# Patient Record
Sex: Male | Born: 1970 | Race: Black or African American | Hispanic: No | Marital: Married | Smoking: Current every day smoker
Health system: Southern US, Community
[De-identification: ages and names within clinical notes are randomized; demographics above are authoritative.]

---

## 2008-07-05 ENCOUNTER — Emergency Department (HOSPITAL_COMMUNITY): Admission: EM | Admit: 2008-07-05 | Discharge: 2008-07-05 | Payer: Self-pay | Admitting: Emergency Medicine

## 2008-07-18 ENCOUNTER — Ambulatory Visit: Payer: Self-pay | Admitting: Family Medicine

## 2008-07-18 DIAGNOSIS — K029 Dental caries, unspecified: Secondary | ICD-10-CM | POA: Insufficient documentation

## 2008-07-18 DIAGNOSIS — K089 Disorder of teeth and supporting structures, unspecified: Secondary | ICD-10-CM | POA: Insufficient documentation

## 2009-06-15 ENCOUNTER — Encounter (INDEPENDENT_AMBULATORY_CARE_PROVIDER_SITE_OTHER): Payer: Self-pay | Admitting: *Deleted

## 2009-06-15 DIAGNOSIS — F17200 Nicotine dependence, unspecified, uncomplicated: Secondary | ICD-10-CM

## 2013-12-25 ENCOUNTER — Encounter (HOSPITAL_COMMUNITY): Payer: Self-pay | Admitting: Emergency Medicine

## 2013-12-25 ENCOUNTER — Emergency Department (HOSPITAL_COMMUNITY): Payer: Medicaid Other

## 2013-12-25 ENCOUNTER — Emergency Department (HOSPITAL_COMMUNITY)
Admission: EM | Admit: 2013-12-25 | Discharge: 2013-12-25 | Disposition: A | Discharge status: Home / Self Care | Payer: Medicaid Other | Source: Home / Self Care | Attending: Emergency Medicine | Admitting: Emergency Medicine

## 2013-12-25 ENCOUNTER — Emergency Department (HOSPITAL_COMMUNITY)
Admission: EM | Admit: 2013-12-25 | Discharge: 2013-12-25 | Disposition: A | Discharge status: Home / Self Care | Payer: Medicaid Other | Attending: Emergency Medicine | Admitting: Emergency Medicine

## 2013-12-25 DIAGNOSIS — Z23 Encounter for immunization: Secondary | ICD-10-CM | POA: Insufficient documentation

## 2013-12-25 DIAGNOSIS — F17200 Nicotine dependence, unspecified, uncomplicated: Secondary | ICD-10-CM | POA: Insufficient documentation

## 2013-12-25 DIAGNOSIS — R51 Headache: Secondary | ICD-10-CM | POA: Insufficient documentation

## 2013-12-25 DIAGNOSIS — Y939 Activity, unspecified: Secondary | ICD-10-CM | POA: Insufficient documentation

## 2013-12-25 DIAGNOSIS — S0010XA Contusion of unspecified eyelid and periocular area, initial encounter: Secondary | ICD-10-CM | POA: Insufficient documentation

## 2013-12-25 DIAGNOSIS — S0500XA Injury of conjunctiva and corneal abrasion without foreign body, unspecified eye, initial encounter: Secondary | ICD-10-CM

## 2013-12-25 DIAGNOSIS — IMO0002 Reserved for concepts with insufficient information to code with codable children: Secondary | ICD-10-CM | POA: Insufficient documentation

## 2013-12-25 DIAGNOSIS — S058X9A Other injuries of unspecified eye and orbit, initial encounter: Secondary | ICD-10-CM | POA: Insufficient documentation

## 2013-12-25 DIAGNOSIS — Y929 Unspecified place or not applicable: Secondary | ICD-10-CM | POA: Insufficient documentation

## 2013-12-25 MED ORDER — OXYCODONE-ACETAMINOPHEN 5-325 MG PO TABS: 2.0000 | ORAL_TABLET | Freq: Four times a day (QID) | ORAL | Status: DC | PRN

## 2013-12-25 MED ORDER — OXYCODONE-ACETAMINOPHEN 5-325 MG PO TABS
2.0000 | ORAL_TABLET | Freq: Once | ORAL | Status: AC
  Administered 2013-12-25: 2 via ORAL
  Filled 2013-12-25: qty 2

## 2013-12-25 MED ORDER — ONDANSETRON 4 MG PO TBDP
8.0000 mg | ORAL_TABLET | Freq: Once | ORAL | Status: AC
  Administered 2013-12-25: 8 mg via ORAL
  Filled 2013-12-25: qty 2

## 2013-12-25 MED ORDER — HYDROMORPHONE HCL PF 1 MG/ML IJ SOLN
1.0000 mg | Freq: Once | INTRAMUSCULAR | Status: DC
  Filled 2013-12-25: qty 1

## 2013-12-25 MED ORDER — TETANUS-DIPHTH-ACELL PERTUSSIS 5-2.5-18.5 LF-MCG/0.5 IM SUSP
0.5000 mL | Freq: Once | INTRAMUSCULAR | Status: AC
  Administered 2013-12-25: 0.5 mL via INTRAMUSCULAR
  Filled 2013-12-25: qty 0.5

## 2013-12-25 MED ORDER — PROMETHAZINE HCL 25 MG PO TABS: 25.0000 mg | ORAL_TABLET | Freq: Four times a day (QID) | ORAL | Status: DC | PRN

## 2013-12-25 MED ORDER — FLUORESCEIN SODIUM 1 MG OP STRP
1.0000 | ORAL_STRIP | Freq: Once | OPHTHALMIC | Status: AC
  Administered 2013-12-25: 1 via OPHTHALMIC
  Filled 2013-12-25: qty 1

## 2013-12-25 MED ORDER — ERYTHROMYCIN 5 MG/GM OP OINT: TOPICAL_OINTMENT | OPHTHALMIC | Status: DC

## 2013-12-25 MED ORDER — TETRACAINE HCL 0.5 % OP SOLN
2.0000 [drp] | Freq: Once | OPHTHALMIC | Status: AC
  Administered 2013-12-25: 2 [drp] via OPHTHALMIC
  Filled 2013-12-25: qty 2
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
## 2013-12-25 NOTE — Discharge Instructions (Signed)
Corneal Abrasion  The cornea is the clear covering at the front and center of the eye. When looking at the colored portion of the eye (iris), you are looking through the cornea. This very thin tissue is made up of many layers. The surface layer is a single layer of cells (corneal epithelium) and is one of the most sensitive tissues in the body. If a scratch or injury causes the corneal epithelium to come off, it is called a corneal abrasion. If the injury extends to the tissues below the epithelium, the condition is called a corneal ulcer.  CAUSES    Scratches.   Trauma.   Foreign body in the eye.  Some people have recurrences of abrasions in the area of the original injury even after it has healed (recurrent erosion syndrome). Recurrent erosion syndrome generally improves and goes away with time.  SYMPTOMS    Eye pain.   Difficulty or inability to keep the injured eye open.   The eye becomes very sensitive to light.   Recurrent erosions tend to happen suddenly, first thing in the morning, usually after waking up and opening the eye.  DIAGNOSIS   Your health care provider can diagnose a corneal abrasion during an eye exam. Dye is usually placed in the eye using a drop or a small paper strip moistened by your tears. When the eye is examined with a special light, the abrasion shows up clearly because of the dye.  TREATMENT    Small abrasions may be treated with antibiotic drops or ointment alone.   Usually a pressure patch is specially applied. Pressure patches prevent the eye from blinking, allowing the corneal epithelium to heal. A pressure patch also reduces the amount of pain present in the eye during healing. Most corneal abrasions heal within 2 3 days with no effect on vision.  If the abrasion becomes infected and spreads to the deeper tissues of the cornea, a corneal ulcer can result. This is serious because it can cause corneal scarring. Corneal scars interfere with light passing through the cornea  and cause a loss of vision in the involved eye.  HOME CARE INSTRUCTIONS   Use medicine or ointment as directed. Only take over-the-counter or prescription medicines for pain, discomfort, or fever as directed by your health care provider.   Do not drive or operate machinery while your eye is patched. Your ability to judge distances is impaired.   If your health care provider has given you a follow-up appointment, it is very important to keep that appointment. Not keeping the appointment could result in a severe eye infection or permanent loss of vision. If there is any problem keeping the appointment, let your health care provider know.  SEEK MEDICAL CARE IF:    You have pain, light sensitivity, and a scratchy feeling in one eye or both eyes.   Your pressure patch keeps loosening up, and you can blink your eye under the patch after treatment.   Any kind of discharge develops from the eye after treatment or if the lids stick together in the morning.   You have the same symptoms in the morning as you did with the original abrasion days, weeks, or months after the abrasion healed.  MAKE SURE YOU:    Understand these instructions.   Will watch your condition.   Will get help right away if you are not doing well or get worse.  Document Released: 09/11/2000 Document Revised: 07/05/2013 Document Reviewed: 05/22/2013  ExitCare Patient Information 
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
## 2013-12-25 NOTE — ED Notes (Signed)
PT refused IM pain meds. Verble order given for PO pain med.

## 2013-12-25 NOTE — ED Notes (Signed)
Family taken to room.

## 2013-12-25 NOTE — ED Notes (Signed)
Patient transported to CT 

## 2013-12-25 NOTE — ED Notes (Addendum)
Pt now stating that this is not correct name; pt initially verified ID as incorrect info; charting deleted and completed on wrong pt

## 2013-12-27 NOTE — ED Provider Notes (Signed)
Medical screening examination/treatment/procedure(s) were performed by non-physician practitioner and as supervising physician I was immediately available for consultation/collaboration.     Hurman HornJohn M Roxine Whittinghill, MD 12/27/13 [phone]

## 2014-11-13 ENCOUNTER — Encounter (HOSPITAL_COMMUNITY): Payer: Self-pay | Admitting: *Deleted

## 2014-11-13 ENCOUNTER — Emergency Department (INDEPENDENT_AMBULATORY_CARE_PROVIDER_SITE_OTHER)
Admission: EM | Admit: 2014-11-13 | Discharge: 2014-11-13 | Disposition: A | Discharge status: Home / Self Care | Payer: Medicaid Other | Source: Home / Self Care | Attending: Family Medicine | Admitting: Family Medicine

## 2014-11-13 DIAGNOSIS — K0889 Other specified disorders of teeth and supporting structures: Secondary | ICD-10-CM

## 2014-11-13 DIAGNOSIS — K088 Other specified disorders of teeth and supporting structures: Secondary | ICD-10-CM

## 2014-11-13 MED ORDER — AMOXICILLIN 500 MG PO CAPS: 500.0000 mg | ORAL_CAPSULE | Freq: Two times a day (BID) | ORAL | Status: DC

## 2014-11-13 MED ORDER — HYDROCODONE-ACETAMINOPHEN 5-325 MG PO TABS
1.0000 | ORAL_TABLET | Freq: Four times a day (QID) | ORAL | Status: DC | PRN
Start: 2014-11-13 — End: 2018-09-18

## 2014-11-13 MED ORDER — IBUPROFEN 800 MG PO TABS: 800.0000 mg | ORAL_TABLET | Freq: Three times a day (TID) | ORAL | Status: DC | PRN

## 2014-11-13 NOTE — Discharge Instructions (Signed)
Be sure to see your dentist   Take ibuprofen for pain and norco for very severe pain   Dental Pain A tooth ache may be caused by cavities (tooth decay). Cavities expose the nerve of the tooth to air and hot or cold temperatures. It may come from an infection or abscess (also called a boil or furuncle) around your tooth. It is also often caused by dental caries (tooth decay). This causes the pain you are having. DIAGNOSIS  Your caregiver can diagnose this problem by exam. TREATMENT   If caused by an infection, it may be treated with medications which kill germs (antibiotics) and pain medications as prescribed by your caregiver. Take medications as directed.  Only take over-the-counter or prescription medicines for pain, discomfort, or fever as directed by your caregiver.  Whether the tooth ache today is caused by infection or dental disease, you should see your dentist as soon as possible for further care. SEEK MEDICAL CARE IF: The exam and treatment you received today has been provided on an emergency basis only. This is not a substitute for complete medical or dental care. If your problem worsens or new problems (symptoms) appear, and you are unable to meet with your dentist, call or return to this location. SEEK IMMEDIATE MEDICAL CARE IF:   You have a fever.  You develop redness and swelling of your face, jaw, or neck.  You are unable to open your mouth.  You have severe pain uncontrolled by pain medicine. MAKE SURE YOU:   Understand these instructions.  Will watch your condition.  Will get help right away if you are not doing well or get worse. Document Released: 09/14/2005 Document Revised: 12/07/2011 Document Reviewed: 05/02/2008 [facility] Patient Information 2015 [employer], [place]. This information is not intended to replace advice given to you by your health care provider. Make sure you discuss any questions you have with your health care provider.

## 2014-11-13 NOTE — ED Notes (Signed)
Pt  Reports  Pain  And  Swelling  To  r  Lower      Jaw        X  3  Days       -  He  States  The  Swelling  Has   decreased   Some

## 2014-11-13 NOTE — ED Provider Notes (Signed)
CSN: [id number]     Arrival date & time 11/13/14  1242 History   First MD Initiated Contact with Patient 11/13/14 1352     Chief Complaint  Patient presents with  . Dental Pain   (Consider location/radiation/quality/duration/timing/severity/associated sxs/prior Treatment) HPI   Severe with complaints of 3 days of right upper tooth pain. Explains that the pain started about 3 days ago as being constant dull pain worsened by eating. It's been progressively worsening since that time. He denies fevers but states that 2 nights ago he woke up in a cold sweat. Pain is interfering with his work and sleep. He denies any chest pain or dyspnea. He is a dental appointment 2 weeks from now. He states he's used 800 mg Motrin with some improvement but it was short-lived.  History reviewed. No pertinent past medical history. History reviewed. No pertinent past surgical history. History reviewed. No pertinent family history. History  Substance Use Topics  . Smoking status: Current Every Day Smoker  . Smokeless tobacco: Not on file  . Alcohol Use: Not on file    Review of Systems   Per HPI  Allergies  Review of patient's allergies indicates no known allergies.  Home Medications   Prior to Admission medications   Medication Sig Start Date End Date Taking? Authorizing Provider  amoxicillin (AMOXIL) 500 MG capsule Take 1 capsule (500 mg total) by mouth 2 (two) times daily. 11/13/14   Elenora GammaSamuel L Tai Skelly, MD  HYDROcodone-acetaminophen (NORCO) 5-325 MG per tablet Take 1 tablet by mouth every 6 (six) hours as needed for moderate pain. 11/13/14   Elenora GammaSamuel L Gertrude Tarbet, MD  ibuprofen (ADVIL,MOTRIN) 800 MG tablet Take 1 tablet (800 mg total) by mouth every 8 (eight) hours as needed. 11/13/14   Elenora GammaSamuel L Laiklyn Pilkenton, MD  promethazine (PHENERGAN) 25 MG tablet Take 1 tablet (25 mg total) by mouth every 6 (six) hours as needed for nausea or vomiting. 12/25/13   Mora BellmanHannah S Merrell, PA-C   BP 120/85 mmHg  Pulse 66  Temp(Src)  98.1 F (36.7 C) (Oral)  Resp 16  SpO2 98% Physical Exam  Constitutional: He is oriented to person, place, and time. He appears well-developed and well-nourished. No distress.  HENT:  Head: Normocephalic.  Mouth/Throat:    R sided facial swelling  Cardiovascular: Normal rate, regular rhythm and normal heart sounds.   No murmur heard. Pulmonary/Chest: Effort normal and breath sounds normal. No respiratory distress.  Neurological: He is alert and oriented to person, place, and time.    ED Course  Procedures (including critical care time) Labs Review Labs Reviewed - No data to display  Imaging Review No results found.   MDM   1. Pain, dental    44 year old male here with dental pain likely due to dental abscess. Stressed importance of follow-up with dentistry. Started amoxicillin, given NSAIDs, and also small amount of Norco for most severe pain.  Pt seen and discussed with Dr. Denyse Amassorey and he agrees with the plan as discussed above.      Elenora GammaSamuel L Romen Yutzy, MD 11/13/14 [phone]

## 2015-02-06 IMAGING — CT CT MAXILLOFACIAL W/O CM
3 of 5 series · 16 of 47 positions shown, 19 images · non-contrast
Comparison: None.

CLINICAL DATA: Blow to the right eye with pain and swelling.

EXAM:
CT HEAD WITHOUT CONTRAST
CT MAXILLOFACIAL WITHOUT CONTRAST
TECHNIQUE: Multidetector CT imaging of the head and maxillofacial structures
were performed using the standard protocol without intravenous
contrast. Multiplanar CT image reconstructions of the maxillofacial
structures were also generated.

[Series 4: facial/ orbits 2.0 h30s · axial · 0.35mm/px · z∈[-228,-74]mm · 10 of 91 slices shown, 13 images]
[im 7/91  brain]
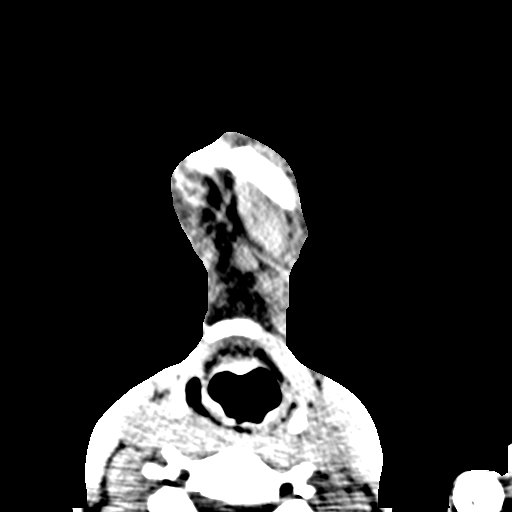
[im 7/91  bone]
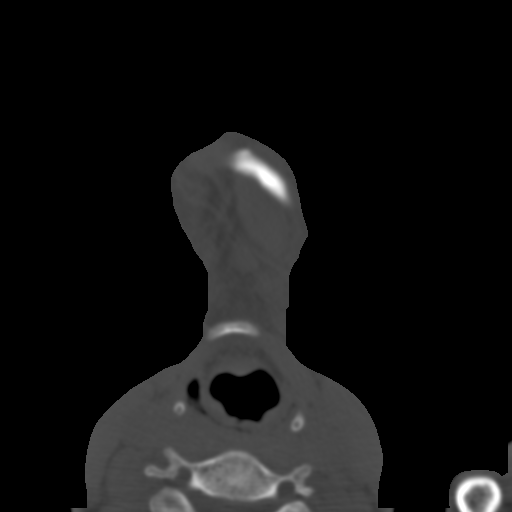
[im 14/91  bone]
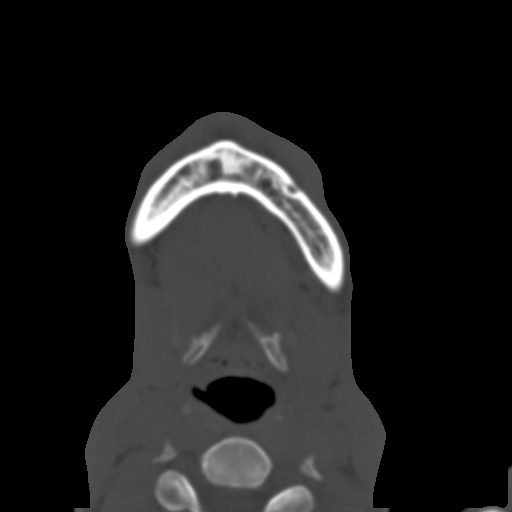
[im 28/91  bone]
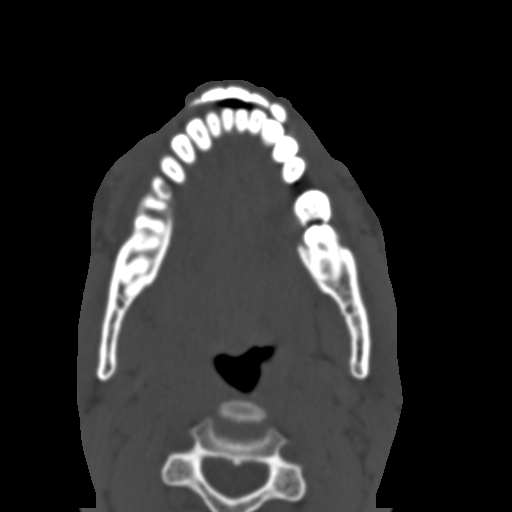
[im 35/91  bone]
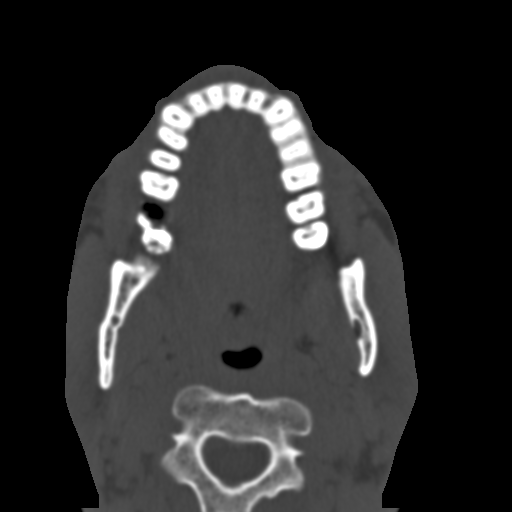
[im 42/91  brain]
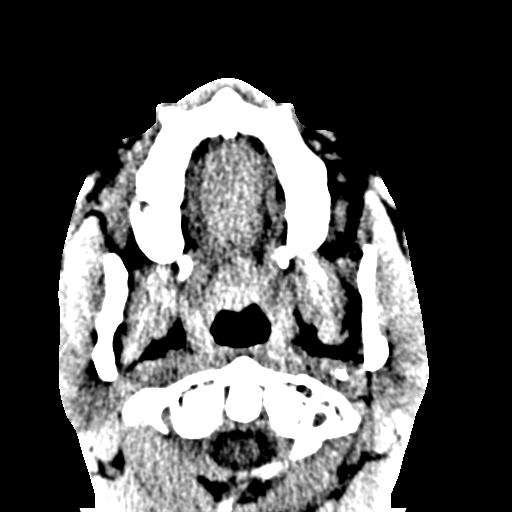
[im 42/91  bone]
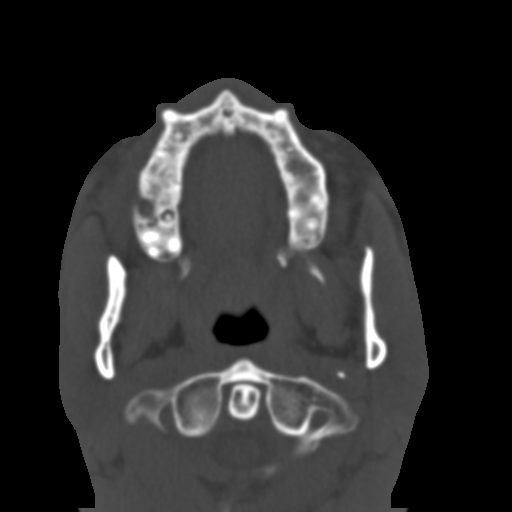
[im 49/91  bone]
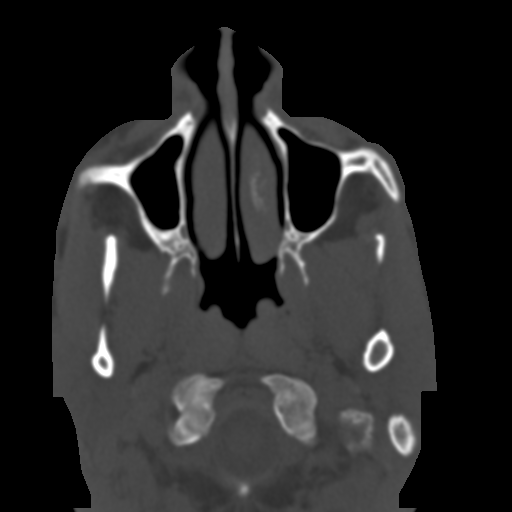
[im 56/91  bone]
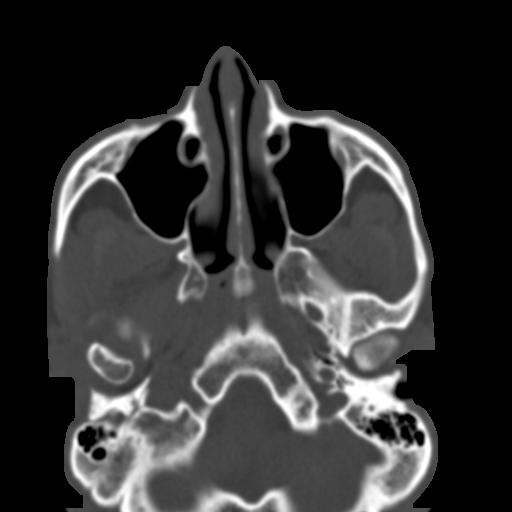
[im 70/91  bone]
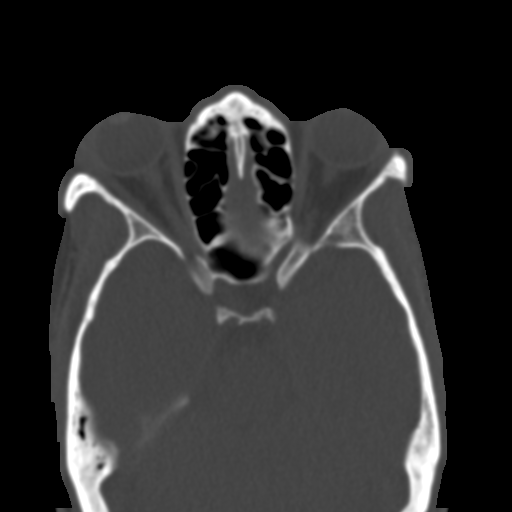
[im 77/91  brain]
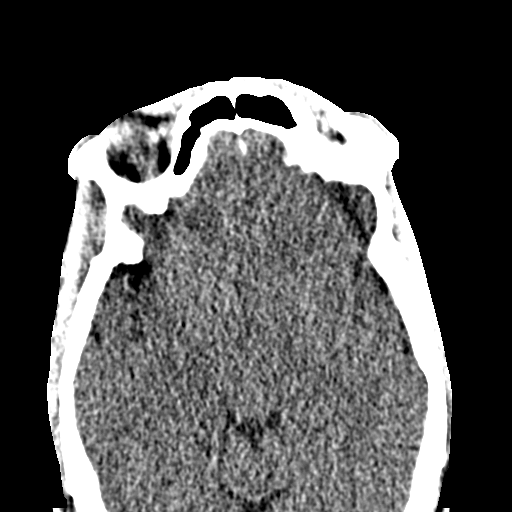
[im 77/91  bone]
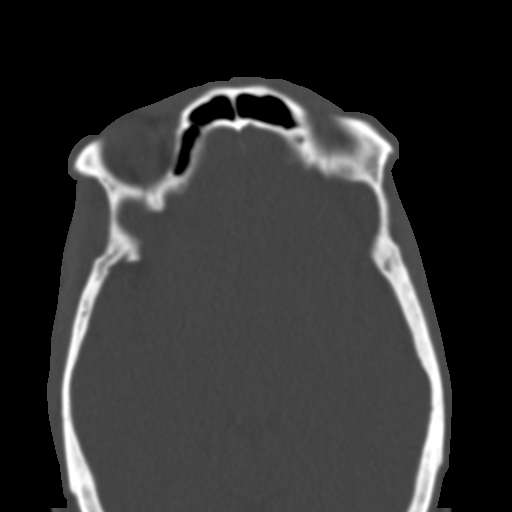
[im 84/91  bone]
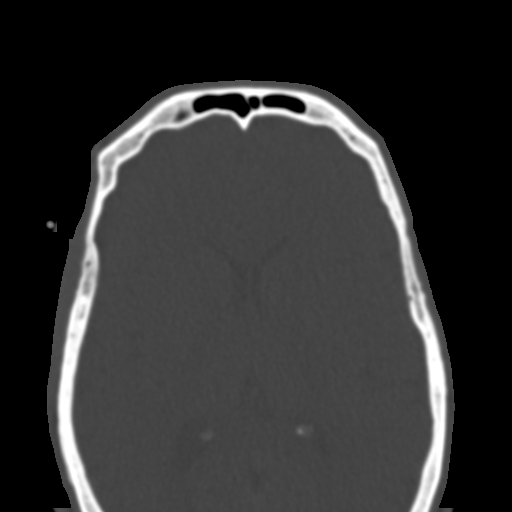

[Series 8: coronal soft tissue · coronal · 0.39mm/px · 3 of 77 slices shown]
[im 26/77  bone]
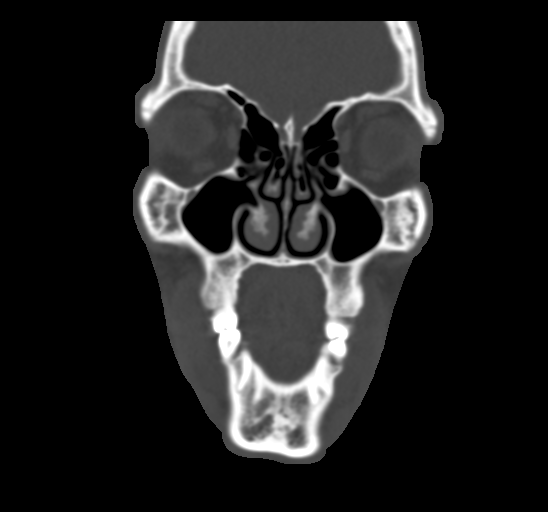
[im 34/77  bone]
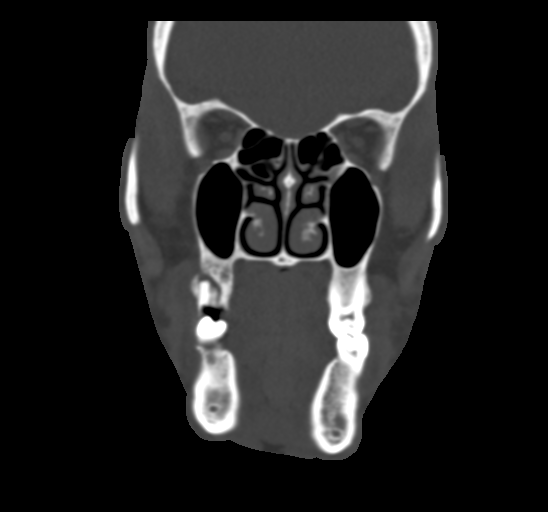
[im 43/77  bone]
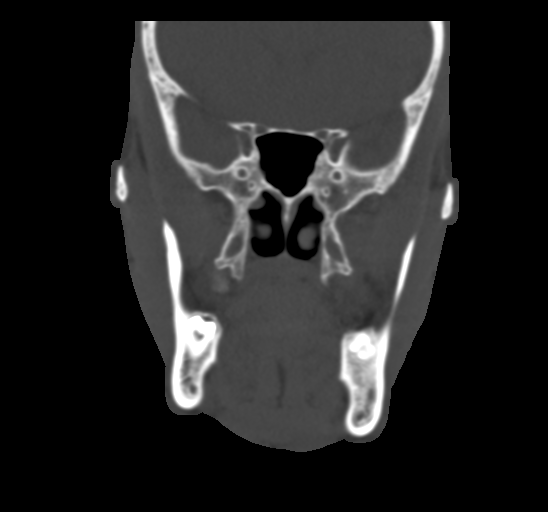

[Series 9: sagittal soft tissue · sagittal · 0.38mm/px · 3 of 74 slices shown]
[im 25/74  bone]
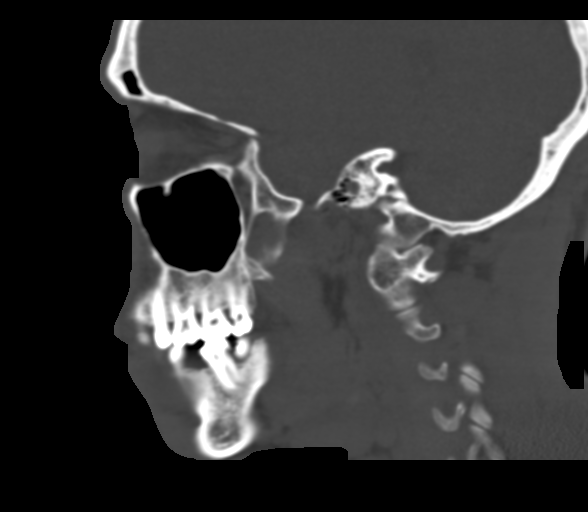
[im 37/74  bone]
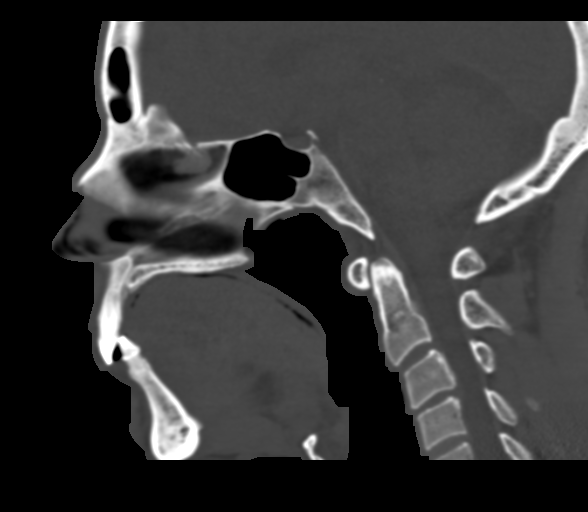
[im 49/74  bone]
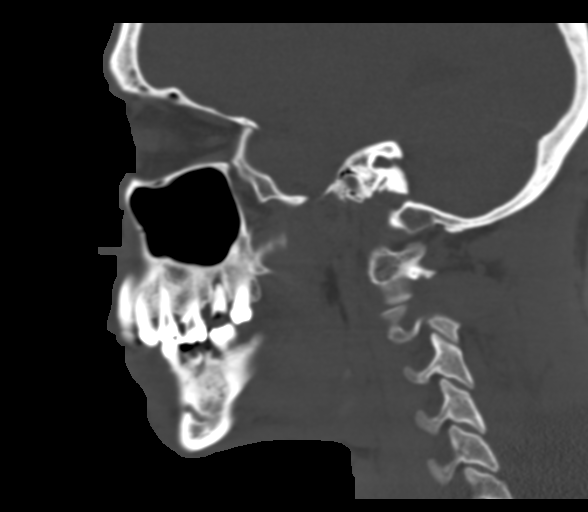

[16 of 47 positions shown; findings below may reference images not displayed]

FINDINGS: CT HEAD FINDINGS

The brain appears normal without hemorrhage, infarct, mass lesion,
mass effect, midline shift or abnormal extra-axial fluid collection.
There is no hydrocephalus or pneumocephalus. The calvarium is
intact.

CT MAXILLOFACIAL FINDINGS

Hematoma is seen about the right eye. The globes are intact and the
lenses are located bilaterally. Orbital fat is clear. There is no
postseptal abnormality. No foreign body is identified. There is no
fracture.
IMPRESSION: Soft tissue contusion about the right eye without underlying orbital
abnormality. The examination is otherwise negative.

## 2018-09-18 ENCOUNTER — Ambulatory Visit (HOSPITAL_COMMUNITY)
Admission: EM | Admit: 2018-09-18 | Discharge: 2018-09-18 | Disposition: A | Discharge status: Home / Self Care | Payer: Self-pay | Attending: Internal Medicine | Admitting: Internal Medicine

## 2018-09-18 ENCOUNTER — Encounter (HOSPITAL_COMMUNITY): Payer: Self-pay | Admitting: *Deleted

## 2018-09-18 DIAGNOSIS — S0502XA Injury of conjunctiva and corneal abrasion without foreign body, left eye, initial encounter: Secondary | ICD-10-CM

## 2018-09-18 MED ORDER — POLYETHYL GLYCOL-PROPYL GLYCOL 0.4-0.3 % OP GEL: 1.0000 "application " | Freq: Every evening | OPHTHALMIC | 0 refills | Status: AC | PRN

## 2018-09-18 MED ORDER — OFLOXACIN 0.3 % OP SOLN: OPHTHALMIC | 0 refills | Status: AC

## 2018-09-18 NOTE — Discharge Instructions (Addendum)
Use ofloxacin eyedrops as directed on left eye. Artificial tear gel at night. Wait 10-15 minutes between drops, always use artificial tear gel last, as it prevents drops from penetrating through. You can put both in the fridge, and it can soothe the eye more. Follow up with ophthalmology tomorrow for further evaluation needed. If experiencing worsening symptoms prior to seeing ophthalmology, go to the emergency department for further evaluation needed.

## 2018-09-18 NOTE — ED Provider Notes (Signed)
[facility]    CSN: [id number] Arrival date & time: 09/18/18  1118     History   Chief Complaint Chief Complaint  Patient presents with  . Eye Drainage    HPI Nelta Numbersric Chiara is a 47 y.o. male.   47 year old male comes in for 1 day history of acute onset left eye pain with tearing.  Patient states for the past week, has intermittent eye itching that is worse in the morning, and resolves throughout the day.  He has been rubbing his eye, and had acute onset of left eye pain yesterday.  He has tearing without obvious purulent discharge.  Denies crusting in the morning.  States has been unable to open his eye, and unable to assess visual changes, but has had photophobia.  Denies contact lens wear glasses use.  Has not tried anything for the symptoms.     History reviewed. No pertinent past medical history.  Patient Active Problem List   Diagnosis Date Noted  . TOBACCO USER 06/15/2009  . DENTAL CARIES 07/18/2008  . DENTAL PAIN 07/18/2008    History reviewed. No pertinent surgical history.     Home Medications    Prior to Admission medications   Medication Sig Start Date End Date Taking? Authorizing Provider  ofloxacin (OCUFLOX) 0.3 % ophthalmic solution 1 drop in left eye every 2 hours for the first 2 days, then 4 times a day for the next 5 days. 09/18/18   Cathie HoopsYu,  V, PA-C  Polyethyl Glycol-Propyl Glycol (SYSTANE) 0.4-0.3 % GEL ophthalmic gel Place 1 application into both eyes at bedtime as needed. 09/18/18   Belinda FisherYu,  V, PA-C    Family History Family History  Problem Relation Age of Onset  . Cancer Mother   . Cancer Father     Social History Social History   Tobacco Use  . Smoking status: Current Every Day Smoker  . Smokeless tobacco: Never Used  Substance Use Topics  . Alcohol use: Not Currently  . Drug use: Never     Allergies   Patient has no known allergies.   Review of Systems Review of Systems  Reason unable to perform ROS: See HPI as  above.     Physical Exam Triage Vital Signs ED Triage Vitals [09/18/18 1221]  Enc Vitals Group     BP 111/72     Pulse Rate 68     Resp 16     Temp 98.1 F (36.7 C)     Temp Source Oral     SpO2 99 %     Weight      Height      Head Circumference      Peak Flow      Pain Score 10     Pain Loc      Pain Edu?      Excl. in GC?    No data found.  Updated Vital Signs BP 111/72   Pulse 68   Temp 98.1 F (36.7 C) (Oral)   Resp 16   SpO2 99%   Visual Acuity (post tetracaine)  Right Eye Distance: 20/40 Left Eye Distance: 20/30 Bilateral Distance: 20/25  Right Eye Near:   Left Eye Near:    Bilateral Near:     Physical Exam Constitutional:      General: He is not in acute distress.    Appearance: He is well-developed. He is not toxic-appearing or diaphoretic.  HENT:     Head: Normocephalic and atraumatic.  Eyes:  General: Lids are normal. Lids are everted, no foreign bodies appreciated.     Extraocular Movements: Extraocular movements intact.     Conjunctiva/sclera:     Right eye: Right conjunctiva is not injected.     Left eye: Left conjunctiva is injected.     Pupils: Pupils are equal, round, and reactive to light.     Comments: Patient unable to open left eye for visual acuity.  Tetracaine applied with good relief.  No photophobia on exam.  No obvious defects prior to fluorescein stain.  Fluorescein stain with linear abrasion across the iris and pupils.  About 0.5 cm long.  Negative Seidel sign.  Neck:     Musculoskeletal: Normal range of motion and neck supple.  Skin:    General: Skin is warm and dry.  Neurological:     Mental Status: He is alert and oriented to person, place, and time.      UC Treatments / Results  Labs (all labs ordered are listed, but only abnormal results are displayed) Labs Reviewed - No data to display  EKG None  Radiology No results found.  Procedures Procedures (including critical care time)  Medications Ordered  in UC Medications - No data to display  Initial Impression / Assessment and Plan / UC Course  I have reviewed the triage vital signs and the nursing notes.  Pertinent labs & imaging results that were available during my care of the patient were reviewed by me and considered in my medical decision making (see chart for details).    Start ofloxacin as directed.  Other symptomatic treatment discussed.  Patient to follow-up with ophthalmology tomorrow for further evaluation and management needed.  Return precautions given.  Patient expresses understanding and agrees with plan.  Final Clinical Impressions(s) / UC Diagnoses   Final diagnoses:  Abrasion of left cornea, initial encounter    ED Prescriptions    Medication Sig Dispense Auth. Provider   ofloxacin (OCUFLOX) 0.3 % ophthalmic solution 1 drop in left eye every 2 hours for the first 2 days, then 4 times a day for the next 5 days. 5 mL ,  V, PA-C   Polyethyl Glycol-Propyl Glycol (SYSTANE) 0.4-0.3 % GEL ophthalmic gel Place 1 application into both eyes at bedtime as needed. 1 Bottle Threasa Alpha,  V, PA-C        ,  V, [place]C 09/18/18 1324

## 2018-09-18 NOTE — ED Triage Notes (Signed)
Denies injury or any known foreign object.  C/O left eye pain, tearing x 3 days with left eye blurred vision and sensation of "like glass in my eye".
